# Patient Record
Sex: Female | Born: 1990 | Race: White | Hispanic: No | Marital: Single | Smoking: Never smoker
Health system: Southern US, Community
[De-identification: ages and names within clinical notes are randomized; demographics above are authoritative.]

## PROBLEM LIST (undated history)

## (undated) DIAGNOSIS — F410 Panic disorder [episodic paroxysmal anxiety] without agoraphobia: Secondary | ICD-10-CM

## (undated) HISTORY — PX: KNEE SURGERY: SHX244

---
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
## 2014-05-03 ENCOUNTER — Emergency Department (HOSPITAL_BASED_OUTPATIENT_CLINIC_OR_DEPARTMENT_OTHER)
Admission: EM | Admit: 2014-05-03 | Discharge: 2014-05-03 | Disposition: A | Discharge status: Home / Self Care | Payer: Medicaid Other | Attending: Emergency Medicine | Admitting: Emergency Medicine

## 2014-05-03 ENCOUNTER — Encounter (HOSPITAL_BASED_OUTPATIENT_CLINIC_OR_DEPARTMENT_OTHER): Payer: Self-pay | Admitting: *Deleted

## 2014-05-03 ENCOUNTER — Emergency Department (HOSPITAL_BASED_OUTPATIENT_CLINIC_OR_DEPARTMENT_OTHER): Payer: Medicaid Other

## 2014-05-03 DIAGNOSIS — F410 Panic disorder [episodic paroxysmal anxiety] without agoraphobia: Secondary | ICD-10-CM | POA: Diagnosis not present

## 2014-05-03 DIAGNOSIS — S70212A Abrasion, left hip, initial encounter: Secondary | ICD-10-CM | POA: Insufficient documentation

## 2014-05-03 DIAGNOSIS — Z792 Long term (current) use of antibiotics: Secondary | ICD-10-CM | POA: Insufficient documentation

## 2014-05-03 DIAGNOSIS — Z3202 Encounter for pregnancy test, result negative: Secondary | ICD-10-CM | POA: Diagnosis not present

## 2014-05-03 DIAGNOSIS — S61512A Laceration without foreign body of left wrist, initial encounter: Secondary | ICD-10-CM | POA: Diagnosis not present

## 2014-05-03 DIAGNOSIS — Z79899 Other long term (current) drug therapy: Secondary | ICD-10-CM | POA: Insufficient documentation

## 2014-05-03 DIAGNOSIS — Y9289 Other specified places as the place of occurrence of the external cause: Secondary | ICD-10-CM | POA: Diagnosis not present

## 2014-05-03 DIAGNOSIS — Y998 Other external cause status: Secondary | ICD-10-CM | POA: Insufficient documentation

## 2014-05-03 DIAGNOSIS — Y9389 Activity, other specified: Secondary | ICD-10-CM | POA: Insufficient documentation

## 2014-05-03 DIAGNOSIS — IMO0002 Reserved for concepts with insufficient information to code with codable children: Secondary | ICD-10-CM

## 2014-05-03 DIAGNOSIS — S0990XA Unspecified injury of head, initial encounter: Secondary | ICD-10-CM | POA: Diagnosis not present

## 2014-05-03 DIAGNOSIS — T148XXA Other injury of unspecified body region, initial encounter: Secondary | ICD-10-CM

## 2014-05-03 HISTORY — DX: Panic disorder (episodic paroxysmal anxiety): F41.0

## 2014-05-03 LAB — PREGNANCY, URINE: Preg Test, Ur: NEGATIVE

## 2014-05-03 MED ORDER — CEPHALEXIN 250 MG PO CAPS
500.0000 mg | ORAL_CAPSULE | Freq: Once | ORAL | Status: AC
  Administered 2014-05-03: 500 mg via ORAL
  Filled 2014-05-03: qty 2

## 2014-05-03 MED ORDER — OXYCODONE-ACETAMINOPHEN 5-325 MG PO TABS
1.0000 | ORAL_TABLET | Freq: Once | ORAL | Status: AC
  Administered 2014-05-03: 1 via ORAL
  Filled 2014-05-03: qty 1

## 2014-05-03 MED ORDER — BACITRACIN 500 UNIT/GM EX OINT
1.0000 "application " | TOPICAL_OINTMENT | Freq: Once | CUTANEOUS | Status: AC
  Administered 2014-05-03: 1 via TOPICAL
  Filled 2014-05-03: qty 0.9

## 2014-05-03 MED ORDER — LIDOCAINE-EPINEPHRINE 2 %-1:100000 IJ SOLN
20.0000 mL | Freq: Once | INTRAMUSCULAR | Status: AC
  Administered 2014-05-03: 20 mL via INTRADERMAL
  Filled 2014-05-03: qty 1

## 2014-05-03 MED ORDER — TETANUS-DIPHTH-ACELL PERTUSSIS 5-2.5-18.5 LF-MCG/0.5 IM SUSP
0.5000 mL | Freq: Once | INTRAMUSCULAR | Status: AC
  Administered 2014-05-03: 0.5 mL via INTRAMUSCULAR
  Filled 2014-05-03: qty 0.5

## 2014-05-03 MED ORDER — CEPHALEXIN 500 MG PO CAPS: 500.0000 mg | ORAL_CAPSULE | Freq: Four times a day (QID) | ORAL | Status: AC
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
## 2014-05-03 NOTE — Discharge Instructions (Signed)
Take your antibiotics as directed and to completion. You should never have any leftover antibiotics! Push fluids and stay well hydrated.   Any antibiotic use can reduce the efficacy of hormonal birth control. Please use back up method of contraception.   Keep wound dry and do not remove dressing for 24 hours if possible. After that, wash gently morning and night (every 12 hours) with soap and water. Use a topical antibiotic ointment and cover with a bandaid or gauze.    Do NOT use rubbing alcohol or hydrogen peroxide, do not soak the area   Present to your primary care doctor or the urgent care of your choice, or the ED for suture removal in 7-10 days.   Every attempt was made to remove foreign body (contaminants) from the wound.  However, there is always a chance that some may remain in the wound. This can  increase your risk of infection.   If you see signs of infection (warmth, redness, tenderness, pus, sharp increase in pain, fever, red streaking in the skin) immediately return to the emergency department.   After the wound heals fully, apply sunscreen for 6-12 months to minimize scarring.     Laceration Care, Adult A laceration is a cut or lesion that goes through all layers of the skin and into the tissue just beneath the skin. TREATMENT  Some lacerations may not require closure. Some lacerations may not be able to be closed due to an increased risk of infection. It is important to see your caregiver as soon as possible after an injury to minimize the risk of infection and maximize the opportunity for successful closure. If closure is appropriate, pain medicines may be given, if needed. The wound will be cleaned to help prevent infection. Your caregiver will use stitches (sutures), staples, wound glue (adhesive), or skin adhesive strips to repair the laceration. These tools bring the skin edges together to allow for faster healing and a better cosmetic outcome. However, all wounds will  heal with a scar. Once the wound has healed, scarring can be minimized by covering the wound with sunscreen during the day for 1 full year. HOME CARE INSTRUCTIONS  For sutures or staples:  Keep the wound clean and dry.  If you were given a bandage (dressing), you should change it at least once a day. Also, change the dressing if it becomes wet or dirty, or as directed by your caregiver.  Wash the wound with soap and water 2 times a day. Rinse the wound off with water to remove all soap. Pat the wound dry with a clean towel.  After cleaning, apply a thin layer of the antibiotic ointment as recommended by your caregiver. This will help prevent infection and keep the dressing from sticking.  You may shower as usual after the first 24 hours. Do not soak the wound in water until the sutures are removed.  Only take over-the-counter or prescription medicines for pain, discomfort, or fever as directed by your caregiver.  Get your sutures or staples removed as directed by your caregiver. For skin adhesive strips:  Keep the wound clean and dry.  Do not get the skin adhesive strips wet. You may bathe carefully, using caution to keep the wound dry.  If the wound gets wet, pat it dry with a clean towel.  Skin adhesive strips will fall off on their own. You may trim the strips as the wound heals. Do not remove skin adhesive strips that are still stuck to the  wound. They will fall off in time. For wound adhesive:  You may briefly wet your wound in the shower or bath. Do not soak or scrub the wound. Do not swim. Avoid periods of heavy perspiration until the skin adhesive has fallen off on its own. After showering or bathing, gently pat the wound dry with a clean towel.  Do not apply liquid medicine, cream medicine, or ointment medicine to your wound while the skin adhesive is in place. This may loosen the film before your wound is healed.  If a dressing is placed over the wound, be careful not to  apply tape directly over the skin adhesive. This may cause the adhesive to be pulled off before the wound is healed.  Avoid prolonged exposure to sunlight or tanning lamps while the skin adhesive is in place. Exposure to ultraviolet light in the first year will darken the scar.  The skin adhesive will usually remain in place for 5 to 10 days, then naturally fall off the skin. Do not pick at the adhesive film. You may need a tetanus shot if:  You cannot remember when you had your last tetanus shot.  You have never had a tetanus shot. If you get a tetanus shot, your arm may swell, get red, and feel warm to the touch. This is common and not a problem. If you need a tetanus shot and you choose not to have one, there is a rare chance of getting tetanus. Sickness from tetanus can be serious. SEEK MEDICAL CARE IF:   You have redness, swelling, or increasing pain in the wound.  You see a red line that goes away from the wound.  You have yellowish-white fluid (pus) coming from the wound.  You have a fever.  You notice a bad smell coming from the wound or dressing.  Your wound breaks open before or after sutures have been removed.  You notice something coming out of the wound such as wood or glass.  Your wound is on your hand or foot and you cannot move a finger or toe. SEEK IMMEDIATE MEDICAL CARE IF:   Your pain is not controlled with prescribed medicine.  You have severe swelling around the wound causing pain and numbness or a change in color in your arm, hand, leg, or foot.  Your wound splits open and starts bleeding.  You have worsening numbness, weakness, or loss of function of any joint around or beyond the wound.  You develop painful lumps near the wound or on the skin anywhere on your body. MAKE SURE YOU:   Understand these instructions.  Will watch your condition.  Will get help right away if you are not doing well or get worse. Document Released: 01/23/2005 Document  Revised: 04/17/2011 Document Reviewed: 07/19/2010 [facility] Patient Information 2015 [place], [place]. This information is not intended to replace advice given to you by your health care provider. Make sure you discuss any questions you have with your health care provider.    Emergency Department Resource Guide 1) Find a Doctor and Pay Out of Pocket Although you won't have to find out who is covered by your insurance plan, it is a good idea to ask around and get recommendations. You will then need to call the office and see if the doctor you have chosen will accept you as a new patient and what types of options they offer for patients who are self-pay. Some doctors offer discounts or will set up payment plans for their patients  who do not have insurance, but you will need to ask so you aren't surprised when you get to your appointment.  2) Contact Your Local Health Department Not all health departments have doctors that can see patients for sick visits, but many do, so it is worth a call to see if yours does. If you don't know where your local health department is, you can check in your phone book. The CDC also has a tool to help you locate your state's health department, and many state websites also have listings of all of their local health departments.  3) Find a Walk-in Clinic If your illness is not likely to be very severe or complicated, you may want to try a walk in clinic. These are popping up all over the country in pharmacies, drugstores, and shopping centers. They're usually staffed by nurse practitioners or physician assistants that have been trained to treat common illnesses and complaints. They're usually fairly quick and inexpensive. However, if you have serious medical issues or chronic medical problems, these are probably not your best option.  No Primary Care Doctor: - Call Health Connect at  [phone] - they can help you locate a primary care doctor that  accepts your insurance,  provides certain services, etc. - Physician Referral Service- [phone]  Chronic Pain Problems: Organization         Address  Phone   Notes  [facility]  [phone] Patients need to be referred by their primary care doctor.   Medication Assistance: Organization         Address  Phone   Notes  [facility] [address] [place], [place] [address] [phone] --Must be a resident of [facility] -- Must have NO insurance coverage whatsoever (no Medicaid/ Medicare, etc.) -- The pt. MUST have a primary care doctor that directs their care regularly and follows them in the community   MedAssist  [phone]   [employer]  [phone]    Agencies that provide inexpensive medical care: Organization         Address  Phone   Notes  [facility]  [phone]   [facility]    [phone]   [facility] [address] [place], [place] [address] [phone]   [facility] [address] [place] [place] [phone]   Planned Parenthood    [phone]   [facility]    [phone]   [facility]  [address], [place] Phone:  [phone], Fax:  [phone] Hours of Operation:  9 am - 6 pm, M-F.  Also accepts Medicaid/Medicare and self-pay.  [facility]  [address], [place] Phone: [phone], Fax: [phone]. Hours of Operation:  8:30 am - 5:30 pm, M-F.  Also accepts Medicaid and self-pay.  [facility] [place] [address], [place] Phone: [phone]   [facility] [address] Natasha Bence [place], [place] [phone], Ext. [phone] Mondays & Thursdays: 7-9 AM.  First 15 patients are seen on a first come, first serve basis.    Medicaid-accepting [facility] Providers:  Organization         Address  Phone    Notes  [facility] [address], [place] [phone] Also accepts self-pay patients.  [facility] [address] Laurell Josephs [place], [place]  [phone]   [facility] [address], [place] [phone]   [facility] [address] [place] [phone]   Renaye Rakers [address], [place]   [phone] Only accepts [place] Access [place] patients after they have their name applied to their card.   Self-Pay (no insurance) in [facility]:  Organization         Address  Phone   Notes  Sickle Cell Patients, [facility] [address] [place], [place] [phone]   [facility] [address] [place], [place] [phone]   [facility]  [address], [place] [phone]   [facility]/Dr. Osei-Bonsu  [address], [place] or [address], [place] [phone] Phone number for both [place] and [place] locations is the same.  [facility] [address] [place] [phone]   [facility] [address] [place] or [address] [phone]   [facility] [address] [place] [phone], phone; [phone], fax Sees patients 1st and 3rd Saturday of every month.  Must not qualify for public or private insurance (i.e. Medicaid, Medicare, [place] Health Choice, Veterans' Benefits)  Household income should be no more than 200% of the poverty level The clinic cannot treat you if you are pregnant or think you are pregnant  Sexually transmitted diseases are not treated at the clinic.    Dental Care: Organization         Address  Phone  Notes  [facility] [address] [place], [place] [phone] Accepts children up to age 7 who are enrolled in [place] or [place] Health Choice; pregnant women with a Medicaid card; and children who have applied for Medicaid or [place] Health Choice, but were declined, whose parents can pay a reduced fee at time of service.  [facility] of [facility]  [address], [place] [phone] Accepts children up to age 68 who are enrolled in [place] or [place] Health Choice; pregnant women with a Medicaid card; and children who have applied for Medicaid or [facility] Health Choice, but were declined, whose parents can pay a reduced fee at time of service.  [facility]  [address] [place], [place] [phone] Patients are seen by appointment only. Walk-ins are not accepted. [facility] will see patients 57 years of age and older. Monday - Tuesday (8am-5pm) Most Wednesdays (8:30-5pm) $30 per visit, cash only  [facility] Adult Dental Access PRO[facility]  [address], [facility] [phone] Patients are seen by appointment only. Walk-ins are not accepted. [facility] will see patients 25 years of age and older. One Wednesday Evening (Monthly: Volunteer Based).  $30 per visit, cash only  [employer]  [phone] for adults; Children under age 67, call Graduate Pediatric Dentistry at [phone]. Children aged 5-14, please call [phone] to request a pediatric application.  Dental services are provided in all areas of dental care including fillings, crowns and bridges, complete and partial dentures, implants, gum treatment, root canals, and extractions. Preventive care is also provided. Treatment is provided to both adults and children. Patients  are selected via a lottery and there is often a waiting list.   [facility] [address] [place]  [phone] [email or web address]   [facility] [address] [place], [place] [phone], Ext.  123 Second and Fourth Thursday of each month, opens at 6:30 AM; Clinic ends at 9 AM.  Patients are seen on a first-come first-served basis, and a limited number are seen during each clinic.   [facility]  [address] Ether Griffins [place], [place] [phone]   Eligibility Requirements You must have lived in [place], [place], or [place] counties for at least the last three months.   You cannot be eligible for state or federal sponsored [place], including [employer], [place], or [employer].   You generally cannot be eligible for healthcare insurance through your employer.    How to apply: Eligibility screenings are held every Tuesday and Wednesday afternoon from 1:00 pm until 4:00 pm. You do not need an appointment for the interview!  [facility] [address] [place], [place] [phone]   [facility] Health Department  [phone]   [facility] Health Department  [phone]   [facility] Health Department  [phone]    Behavioral Health Resources in the Community: Intensive Outpatient Programs Organization         Address  Phone  Notes  [facility] [address] [place], [place] [phone]   [facility] [address], [place], [place] [phone]   [facility] [address] [place], [place]  [phone]   [facility] Mental Health [address]  [place], [place] [phone] or [phone]   Substance Abuse Resources Organization         Address  Phone  Notes  [facility] Drug Services  [phone]   [facility]  [phone]   [facility] [place]  [phone]   Floydene Flock  [phone]   Residential & Outpatient Substance Abuse Program  [phone]   Psychological Services Organization         Address  Phone  Notes  [facility] Behavioral Health  [phone]   [facility] Services  [phone]   [facility]  Mental Health [address] [place] [phone] or [phone]    Mobile Crisis Teams Organization         Address  Phone  Notes  [facility], Mobile Crisis Care Unit  [phone]   [facility]  [address] [place], [place] [phone]   Doristine Locks [address] [place] [phone]    Self-Help/Support Groups Organization         Address  Phone             Notes  [facility] [place] - variety of support groups  336- I7437963 Call for more information  Narcotics Anonymous (NA), [facility] [address], [employer]   2 meetings at this location   Statistician         Address  Phone  Notes  [facility] [address],    [place]  [phone]   [facility]  [address] [place] [address], [place], [place] [phone]   [facility] [address] [place], [place] [phone] Admissions: 8am-3pm M-F  [facility] [address],    [place], [place] [phone]   [facility] [phone]  [address] Schwartz[address], [place], [place] [phone]   [facility] [address]  [place], [place] [phone]   [facility] - Intensive Outpatient [address], [place], [place] [phone]   [facility] ([facility] [address]  [place], [place] [phone] or [phone]   [facility] ([facility]) [address] [place], [place] [phone] Accepts Medicaid  [facility] [address]  [place] [phone] Substance Abuse/Addiction Treatment   [facility] Behavioral Health Resources Organization         Address  Phone  Notes  [facility]  [phone]) [phone]   Angie FavaJulie Brannon, PhD [address] [place], [place]   [phone]) [phone] or [phone]) [phone]   [facility]   [address] [place], [place] [phone]) [phone]   [facility] [address], [place], [place] [phone](336) [phone] Insurance/Medicaid/sponsorship through [facility]  [facility] [address]                                    [place], [place] [phone]) [phone] Therapy/tele-psych/case  [facility] [address]   [place], [place] [phone]) [phone]    Dr. Lolly MustacheArfeen  [phone]) [phone]   [facility] [place]  [employer] [facility] 1) [address], [place] 2) [address], [place] 3)  [address], [place] [phone]) [phone](336) [phone]  [phone](336) [phone]   [facility] [place] Child Abuse Hotline [phone](336) [phone] or [phone]) [phone] (After Hours)
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
## 2014-08-03 ENCOUNTER — Emergency Department (HOSPITAL_COMMUNITY)
Admission: EM | Admit: 2014-08-03 | Discharge: 2014-08-03 | Disposition: A | Discharge status: Home / Self Care | Payer: Medicaid Other | Attending: Emergency Medicine | Admitting: Emergency Medicine

## 2014-08-03 ENCOUNTER — Encounter (HOSPITAL_COMMUNITY): Payer: Self-pay | Admitting: Oncology

## 2014-08-03 ENCOUNTER — Emergency Department (HOSPITAL_COMMUNITY): Payer: Medicaid Other

## 2014-08-03 DIAGNOSIS — Y9389 Activity, other specified: Secondary | ICD-10-CM | POA: Diagnosis not present

## 2014-08-03 DIAGNOSIS — Z79899 Other long term (current) drug therapy: Secondary | ICD-10-CM | POA: Insufficient documentation

## 2014-08-03 DIAGNOSIS — S40011A Contusion of right shoulder, initial encounter: Secondary | ICD-10-CM | POA: Diagnosis not present

## 2014-08-03 DIAGNOSIS — S0003XA Contusion of scalp, initial encounter: Secondary | ICD-10-CM | POA: Insufficient documentation

## 2014-08-03 DIAGNOSIS — Y92810 Car as the place of occurrence of the external cause: Secondary | ICD-10-CM | POA: Insufficient documentation

## 2014-08-03 DIAGNOSIS — S4991XA Unspecified injury of right shoulder and upper arm, initial encounter: Secondary | ICD-10-CM | POA: Diagnosis present

## 2014-08-03 DIAGNOSIS — Z792 Long term (current) use of antibiotics: Secondary | ICD-10-CM | POA: Insufficient documentation

## 2014-08-03 DIAGNOSIS — Y998 Other external cause status: Secondary | ICD-10-CM | POA: Diagnosis not present

## 2014-08-03 DIAGNOSIS — W01198A Fall on same level from slipping, tripping and stumbling with subsequent striking against other object, initial encounter: Secondary | ICD-10-CM | POA: Insufficient documentation

## 2014-08-03 DIAGNOSIS — Z3202 Encounter for pregnancy test, result negative: Secondary | ICD-10-CM | POA: Insufficient documentation

## 2014-08-03 DIAGNOSIS — S060X1A Concussion with loss of consciousness of 30 minutes or less, initial encounter: Secondary | ICD-10-CM | POA: Diagnosis not present

## 2014-08-03 DIAGNOSIS — S0990XA Unspecified injury of head, initial encounter: Secondary | ICD-10-CM | POA: Diagnosis not present

## 2014-08-03 DIAGNOSIS — S40012A Contusion of left shoulder, initial encounter: Secondary | ICD-10-CM | POA: Diagnosis not present

## 2014-08-03 DIAGNOSIS — T148XXA Other injury of unspecified body region, initial encounter: Secondary | ICD-10-CM

## 2014-08-03 DIAGNOSIS — F410 Panic disorder [episodic paroxysmal anxiety] without agoraphobia: Secondary | ICD-10-CM | POA: Insufficient documentation

## 2014-08-03 DIAGNOSIS — W19XXXA Unspecified fall, initial encounter: Secondary | ICD-10-CM

## 2014-08-03 DIAGNOSIS — R58 Hemorrhage, not elsewhere classified: Secondary | ICD-10-CM

## 2014-08-03 LAB — POC URINE PREG, ED: Preg Test, Ur: NEGATIVE

## 2014-08-03 MED ORDER — IBUPROFEN 600 MG PO TABS: 600.0000 mg | ORAL_TABLET | Freq: Four times a day (QID) | ORAL | Status: AC | PRN

## 2014-08-03 MED ORDER — SODIUM CHLORIDE 0.9 % IV BOLUS (SEPSIS)
1000.0000 mL | Freq: Once | INTRAVENOUS | Status: AC
  Administered 2014-08-03: 1000 mL via INTRAVENOUS

## 2014-08-03 MED ORDER — FENTANYL CITRATE (PF) 100 MCG/2ML IJ SOLN
25.0000 ug | Freq: Once | INTRAMUSCULAR | Status: AC
  Administered 2014-08-03: 25 ug via INTRAVENOUS
  Filled 2014-08-03: qty 2

## 2014-08-03 NOTE — Discharge Instructions (Signed)
You have hematoma to the scalp and abrasions at multiple body area. There is no brain bleed however, and we dont think there is any fractures.  Take motrin for pain as needed.   Concussion A concussion, or closed-head injury, is a brain injury caused by a direct blow to the head or by a quick and sudden movement (jolt) of the head or neck. Concussions are usually not life-threatening. Even so, the effects of a concussion can be serious. If you have had a concussion before, you are more likely to experience concussion-like symptoms after a direct blow to the head.  CAUSES  Direct blow to the head, such as from running into another player during a soccer game, being hit in a fight, or hitting your head on a hard surface.  A jolt of the head or neck that causes the brain to move back and forth inside the skull, such as in a car crash. SIGNS AND SYMPTOMS The signs of a concussion can be hard to notice. Early on, they may be missed by you, family members, and health care providers. You may look fine but act or feel differently. Symptoms are usually temporary, but they may last for days, weeks, or even longer. Some symptoms may appear right away while others may not show up for hours or days. Every head injury is different. Symptoms include:  Mild to moderate headaches that will not go away.  A feeling of pressure inside your head.  Having more trouble than usual:  Learning or remembering things you have heard.  Answering questions.  Paying attention or concentrating.  Organizing daily tasks.  Making decisions and solving problems.  Slowness in thinking, acting or reacting, speaking, or reading.  Getting lost or being easily confused.  Feeling tired all the time or lacking energy (fatigued).  Feeling drowsy.  Sleep disturbances.  Sleeping more than usual.  Sleeping less than usual.  Trouble falling asleep.  Trouble sleeping (insomnia).  Loss of balance or feeling  lightheaded or dizzy.  Nausea or vomiting.  Numbness or tingling.  Increased sensitivity to:  Sounds.  Lights.  Distractions.  Vision problems or eyes that tire easily.  Diminished sense of taste or smell.  Ringing in the ears.  Mood changes such as feeling sad or anxious.  Becoming easily irritated or angry for little or no reason.  Lack of motivation.  Seeing or hearing things other people do not see or hear (hallucinations). DIAGNOSIS Your health care provider can usually diagnose a concussion based on a description of your injury and symptoms. He or she will ask whether you passed out (lost consciousness) and whether you are having trouble remembering events that happened right before and during your injury. Your evaluation might include:  A brain scan to look for signs of injury to the brain. Even if the test shows no injury, you may still have a concussion.  Blood tests to be sure other problems are not present. TREATMENT  Concussions are usually treated in an emergency department, in urgent care, or at a clinic. You may need to stay in the hospital overnight for further treatment.  Tell your health care provider if you are taking any medicines, including prescription medicines, over-the-counter medicines, and natural remedies. Some medicines, such as blood thinners (anticoagulants) and aspirin, may increase the chance of complications. Also tell your health care provider whether you have had alcohol or are taking illegal drugs. This information may affect treatment.  Your health care provider will send you  home with important instructions to follow.  How fast you will recover from a concussion depends on many factors. These factors include how severe your concussion is, what part of your brain was injured, your age, and how healthy you were before the concussion.  Most people with mild injuries recover fully. Recovery can take time. In general, recovery is slower in  older persons. Also, persons who have had a concussion in the past or have other medical problems may find that it takes longer to recover from their current injury. HOME CARE INSTRUCTIONS General Instructions  Carefully follow the directions your health care provider gave you.  Only take over-the-counter or prescription medicines for pain, discomfort, or fever as directed by your health care provider.  Take only those medicines that your health care provider has approved.  Do not drink alcohol until your health care provider says you are well enough to do so. Alcohol and certain other drugs may slow your recovery and can put you at risk of further injury.  If it is harder than usual to remember things, write them down.  If you are easily distracted, try to do one thing at a time. For example, do not try to watch TV while fixing dinner.  Talk with family members or close friends when making important decisions.  Keep all follow-up appointments. Repeated evaluation of your symptoms is recommended for your recovery.  Watch your symptoms and tell others to do the same. Complications sometimes occur after a concussion. Older adults with a brain injury may have a higher risk of serious complications, such as a blood clot on the brain.  Tell your teachers, school nurse, school counselor, coach, athletic trainer, or work Production designer, theatre/television/film about your injury, symptoms, and restrictions. Tell them about what you can or cannot do. They should watch for:  Increased problems with attention or concentration.  Increased difficulty remembering or learning new information.  Increased time needed to complete tasks or assignments.  Increased irritability or decreased ability to cope with stress.  Increased symptoms.  Rest. Rest helps the brain to heal. Make sure you:  Get plenty of sleep at night. Avoid staying up late at night.  Keep the same bedtime hours on weekends and weekdays.  Rest during the day.  Take daytime naps or rest breaks when you feel tired.  Limit activities that require a lot of thought or concentration. These include:  Doing homework or job-related work.  Watching TV.  Working on the computer.  Avoid any situation where there is potential for another head injury (football, hockey, soccer, basketball, martial arts, downhill snow sports and horseback riding). Your condition will get worse every time you experience a concussion. You should avoid these activities until you are evaluated by the appropriate follow-up health care providers. Returning To Your Regular Activities You will need to return to your normal activities slowly, not all at once. You must give your body and brain enough time for recovery.  Do not return to sports or other athletic activities until your health care provider tells you it is safe to do so.  Ask your health care provider when you can drive, ride a bicycle, or operate heavy machinery. Your ability to react may be slower after a brain injury. Never do these activities if you are dizzy.  Ask your health care provider about when you can return to work or school. Preventing Another Concussion It is very important to avoid another brain injury, especially before you have recovered. In rare  cases, another injury can lead to permanent brain damage, brain swelling, or death. The risk of this is greatest during the first 7-10 days after a head injury. Avoid injuries by:  Wearing a seat belt when riding in a car.  Drinking alcohol only in moderation.  Wearing a helmet when biking, skiing, skateboarding, skating, or doing similar activities.  Avoiding activities that could lead to a second concussion, such as contact or recreational sports, until your health care provider says it is okay.  Taking safety measures in your home.  Remove clutter and tripping hazards from floors and stairways.  Use grab bars in bathrooms and handrails by stairs.  Place  non-slip mats on floors and in bathtubs.  Improve lighting in dim areas. SEEK MEDICAL CARE IF:  You have increased problems paying attention or concentrating.  You have increased difficulty remembering or learning new information.  You need more time to complete tasks or assignments than before.  You have increased irritability or decreased ability to cope with stress.  You have more symptoms than before. Seek medical care if you have any of the following symptoms for more than 2 weeks after your injury:  Lasting (chronic) headaches.  Dizziness or balance problems.  Nausea.  Vision problems.  Increased sensitivity to noise or light.  Depression or mood swings.  Anxiety or irritability.  Memory problems.  Difficulty concentrating or paying attention.  Sleep problems.  Feeling tired all the time. SEEK IMMEDIATE MEDICAL CARE IF:  You have severe or worsening headaches. These may be a sign of a blood clot in the brain.  You have weakness (even if only in one hand, leg, or part of the face).  You have numbness.  You have decreased coordination.  You vomit repeatedly.  You have increased sleepiness.  One pupil is larger than the other.  You have convulsions.  You have slurred speech.  You have increased confusion. This may be a sign of a blood clot in the brain.  You have increased restlessness, agitation, or irritability.  You are unable to recognize people or places.  You have neck pain.  It is difficult to wake you up.  You have unusual behavior changes.  You lose consciousness. MAKE SURE YOU:  Understand these instructions.  Will watch your condition.  Will get help right away if you are not doing well or get worse. Document Released: 04/15/2003 Document Revised: 01/28/2013 Document Reviewed: 08/15/2012 [facility] Patient Information 2015 [place], [place]. This information is not intended to replace advice given to you by your health care  provider. Make sure you discuss any questions you have with your health care provider.  Contusion A contusion is a deep bruise. Contusions are the result of an injury that caused bleeding under the skin. The contusion may turn blue, purple, or yellow. Minor injuries will give you a painless contusion, but more severe contusions may stay painful and swollen for a few weeks.  CAUSES  A contusion is usually caused by a blow, trauma, or direct force to an area of the body. SYMPTOMS   Swelling and redness of the injured area.  Bruising of the injured area.  Tenderness and soreness of the injured area.  Pain. DIAGNOSIS  The diagnosis can be made by taking a history and physical exam. An X-ray, CT scan, or MRI may be needed to determine if there were any associated injuries, such as fractures. TREATMENT  Specific treatment will depend on what area of the body was injured. In general,  the best treatment for a contusion is resting, icing, elevating, and applying cold compresses to the injured area. Over-the-counter medicines may also be recommended for pain control. Ask your caregiver what the best treatment is for your contusion. HOME CARE INSTRUCTIONS   Put ice on the injured area.  Put ice in a plastic bag.  Place a towel between your skin and the bag.  Leave the ice on for 15-20 minutes, 3-4 times a day, or as directed by your health care provider.  Only take over-the-counter or prescription medicines for pain, discomfort, or fever as directed by your caregiver. Your caregiver may recommend avoiding anti-inflammatory medicines (aspirin, ibuprofen, and naproxen) for 48 hours because these medicines may increase bruising.  Rest the injured area.  If possible, elevate the injured area to reduce swelling. SEEK IMMEDIATE MEDICAL CARE IF:   You have increased bruising or swelling.  You have pain that is getting worse.  Your swelling or pain is not relieved with medicines. MAKE SURE YOU:    Understand these instructions.  Will watch your condition.  Will get help right away if you are not doing well or get worse. Document Released: 11/02/2004 Document Revised: 01/28/2013 Document Reviewed: 11/28/2010 [facility] Patient Information 2015 [place]. This information is not intended to replace advice given to you by your health care provider. Make sure you discuss any questions you have with your health care provider.
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
## 2016-05-12 ENCOUNTER — Encounter: Payer: Self-pay | Admitting: Emergency Medicine

## 2016-05-12 ENCOUNTER — Emergency Department
Admission: EM | Admit: 2016-05-12 | Discharge: 2016-05-12 | Disposition: A | Discharge status: Home / Self Care | Payer: Medicaid Other | Attending: Emergency Medicine | Admitting: Emergency Medicine

## 2016-05-12 DIAGNOSIS — S60312A Abrasion of left thumb, initial encounter: Secondary | ICD-10-CM | POA: Insufficient documentation

## 2016-05-12 DIAGNOSIS — Y92009 Unspecified place in unspecified non-institutional (private) residence as the place of occurrence of the external cause: Secondary | ICD-10-CM | POA: Diagnosis not present

## 2016-05-12 DIAGNOSIS — Y999 Unspecified external cause status: Secondary | ICD-10-CM | POA: Diagnosis not present

## 2016-05-12 DIAGNOSIS — Y93G3 Activity, cooking and baking: Secondary | ICD-10-CM | POA: Insufficient documentation

## 2016-05-12 DIAGNOSIS — S60419A Abrasion of unspecified finger, initial encounter: Secondary | ICD-10-CM

## 2016-05-12 DIAGNOSIS — S61012A Laceration without foreign body of left thumb without damage to nail, initial encounter: Secondary | ICD-10-CM | POA: Diagnosis present

## 2016-05-12 DIAGNOSIS — W260XXA Contact with knife, initial encounter: Secondary | ICD-10-CM | POA: Insufficient documentation

## 2016-05-12 MED ORDER — ACETAMINOPHEN 500 MG PO TABS
1000.0000 mg | ORAL_TABLET | Freq: Once | ORAL | Status: AC
  Administered 2016-05-12: 1000 mg via ORAL
  Filled 2016-05-12: qty 2

## 2016-05-12 MED ORDER — OXYCODONE-ACETAMINOPHEN 5-325 MG PO TABS
1.0000 | ORAL_TABLET | Freq: Once | ORAL | Status: AC
Start: 2016-05-12 — End: 2016-05-12
  Administered 2016-05-12: 1 via ORAL

## 2016-05-12 MED ORDER — OXYCODONE-ACETAMINOPHEN 5-325 MG PO TABS
ORAL_TABLET | ORAL | Status: AC
  Administered 2016-05-12: 1 via ORAL
  Filled 2016-05-12: qty 1

## 2016-05-12 NOTE — ED Triage Notes (Signed)
Pt ambulatory to triage in NAD, report laceration to left thumb when cutting vegetables for dinner.  Pressure dressing applied by this nurse, actively bleeding.

## 2016-05-12 NOTE — ED Notes (Signed)
Reviewed d/c instructions, follow-up care, and wound care with patient. Pt verbalized understanding.  

## 2016-05-12 NOTE — ED Notes (Signed)
Charge nurse to lobby to speak with pt at pt's request; pt updated on wait time & voices understanding; pt requests tylenol for discomfort & admin as ordered; gauze dressing reapplied to wound; lac noted to pad of thumb with no active bleeding

## 2016-05-12 NOTE — ED Notes (Signed)
Applied xeroform and sterile dressing per MD order.

## 2016-05-12 NOTE — ED Notes (Signed)
ED Provider at bedside. 

## 2016-05-12 NOTE — ED Notes (Signed)
Pt to front desk stating she needs stitches now.  This nurse offered to redress wound but states still people ahead of her to be seen.  Pt walks away before this nurse can redress wound.

## 2016-05-12 NOTE — Discharge Instructions (Signed)
Keep wound clean and dry.  Return to the ER for worsening symptoms, increased redness/swelling, purulent discharge or other concerns. 

## 2016-05-12 NOTE — ED Notes (Signed)
Placed patient's hand in betadine/saline soak per MD order. Pt request to keep hand soaking until RN returns. Patient verbalized understanding.
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
## 2016-09-30 ENCOUNTER — Encounter: Payer: Self-pay | Admitting: Emergency Medicine

## 2016-09-30 ENCOUNTER — Emergency Department
Admission: EM | Admit: 2016-09-30 | Discharge: 2016-09-30 | Disposition: A | Discharge status: Other Acute Inpatient Hospital | Payer: Medicaid Other | Attending: Emergency Medicine | Admitting: Emergency Medicine

## 2016-09-30 DIAGNOSIS — Y92009 Unspecified place in unspecified non-institutional (private) residence as the place of occurrence of the external cause: Secondary | ICD-10-CM | POA: Insufficient documentation

## 2016-09-30 DIAGNOSIS — W01110A Fall on same level from slipping, tripping and stumbling with subsequent striking against sharp glass, initial encounter: Secondary | ICD-10-CM | POA: Diagnosis not present

## 2016-09-30 DIAGNOSIS — S41112A Laceration without foreign body of left upper arm, initial encounter: Secondary | ICD-10-CM | POA: Diagnosis present

## 2016-09-30 DIAGNOSIS — Z79899 Other long term (current) drug therapy: Secondary | ICD-10-CM | POA: Diagnosis not present

## 2016-09-30 DIAGNOSIS — Y939 Activity, unspecified: Secondary | ICD-10-CM | POA: Insufficient documentation

## 2016-09-30 DIAGNOSIS — Y999 Unspecified external cause status: Secondary | ICD-10-CM | POA: Diagnosis not present

## 2016-09-30 DIAGNOSIS — Y929 Unspecified place or not applicable: Secondary | ICD-10-CM | POA: Insufficient documentation

## 2016-09-30 LAB — PREPARE RBC (CROSSMATCH)

## 2016-09-30 MED ORDER — FENTANYL CITRATE (PF) 100 MCG/2ML IJ SOLN
INTRAMUSCULAR | Status: AC
  Filled 2016-09-30: qty 2

## 2016-09-30 MED ORDER — SODIUM CHLORIDE 0.9 % IV BOLUS (SEPSIS)
1000.0000 mL | Freq: Once | INTRAVENOUS | Status: AC
  Administered 2016-09-30: 1000 mL via INTRAVENOUS

## 2016-09-30 MED ORDER — FENTANYL CITRATE (PF) 100 MCG/2ML IJ SOLN
50.0000 ug | Freq: Once | INTRAMUSCULAR | Status: AC
  Administered 2016-09-30: 50 ug via INTRAVENOUS
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
## 2016-09-30 NOTE — ED Notes (Signed)
Emily Evans, primary rn in room.

## 2016-09-30 NOTE — ED Notes (Signed)
Quick clot applied.

## 2016-09-30 NOTE — ED Notes (Signed)
No new bleeding noted, no radial pulse noted per angela, rn to left wrist.

## 2016-09-30 NOTE — ED Notes (Signed)
2nd tourniquet in place, radial pulse not palpable.

## 2016-09-30 NOTE — ED Notes (Signed)
First tourniquet on. No radial pulse palpable after application.
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
## 2016-09-30 NOTE — ED Notes (Signed)
Pt provided verbal consent to transfer to [facility]. Consent witnessed with Tillman Sers, rn.

## 2016-09-30 NOTE — ED Notes (Signed)
[facility] here.  

## 2016-09-30 NOTE — ED Notes (Signed)
Ns up on pressure bags.

## 2016-09-30 NOTE — ED Notes (Signed)
emtala reviewed by this RN 

## 2016-10-02 LAB — BPAM RBC
Blood Product Expiration Date: 201809072359
Blood Product Expiration Date: 201809072359
ISSUE DATE / TIME: 201808250635
ISSUE DATE / TIME: 201808250635
Unit Type and Rh: 9500
Unit Type and Rh: 9500

## 2016-10-02 LAB — TYPE AND SCREEN
ABO/RH(D): O POS
ANTIBODY SCREEN: NEGATIVE
UNIT DIVISION: 0
UNIT DIVISION: 0
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
